# Patient Record
Sex: Female | Born: 1960 | Race: White | Hispanic: No | Marital: Single | Smoking: Never smoker
Health system: Southern US, Community
[De-identification: ages and names within clinical notes are randomized; demographics above are authoritative.]

## PROBLEM LIST (undated history)

## (undated) DIAGNOSIS — N6310 Unspecified lump in the right breast, unspecified quadrant: Secondary | ICD-10-CM

## (undated) DIAGNOSIS — J302 Other seasonal allergic rhinitis: Secondary | ICD-10-CM

## (undated) DIAGNOSIS — B029 Zoster without complications: Secondary | ICD-10-CM

## (undated) DIAGNOSIS — K219 Gastro-esophageal reflux disease without esophagitis: Secondary | ICD-10-CM

## (undated) DIAGNOSIS — N390 Urinary tract infection, site not specified: Secondary | ICD-10-CM

## (undated) HISTORY — PX: TONSILLECTOMY: SUR1361

## (undated) HISTORY — PX: ABDOMINAL HYSTERECTOMY: SHX81

## (undated) HISTORY — PX: OTHER SURGICAL HISTORY: SHX169

---

## 2005-12-30 ENCOUNTER — Emergency Department: Payer: Self-pay | Admitting: Emergency Medicine

## 2005-12-31 ENCOUNTER — Ambulatory Visit: Payer: Self-pay | Admitting: Emergency Medicine

## 2006-01-17 ENCOUNTER — Ambulatory Visit: Payer: Self-pay

## 2006-02-05 ENCOUNTER — Ambulatory Visit: Payer: Self-pay

## 2006-02-08 ENCOUNTER — Ambulatory Visit: Payer: Self-pay

## 2006-05-02 ENCOUNTER — Ambulatory Visit: Payer: Self-pay

## 2007-02-18 ENCOUNTER — Ambulatory Visit: Payer: Self-pay

## 2013-08-26 ENCOUNTER — Ambulatory Visit: Payer: Self-pay | Admitting: Obstetrics and Gynecology

## 2013-08-31 ENCOUNTER — Ambulatory Visit: Payer: Self-pay | Admitting: Obstetrics and Gynecology

## 2013-11-13 ENCOUNTER — Ambulatory Visit: Payer: Self-pay | Admitting: Unknown Physician Specialty

## 2014-04-07 ENCOUNTER — Ambulatory Visit: Payer: Self-pay | Admitting: Obstetrics and Gynecology

## 2014-05-04 IMAGING — US US BREAST*R* LIMITED INC AXILLA
1 series · 7 of 7 positions shown · non-contrast
Comparison: Previous examinations.

CLINICAL DATA: Recall from screening mammogram.

EXAM:
DIGITAL DIAGNOSTIC  right breast MAMMOGRAM
ULTRASOUND right BREAST

[Series 1: us breast*right* limited inc axilla · 0.08mm/px · 7 of 7 slices shown]
[im 1/7]
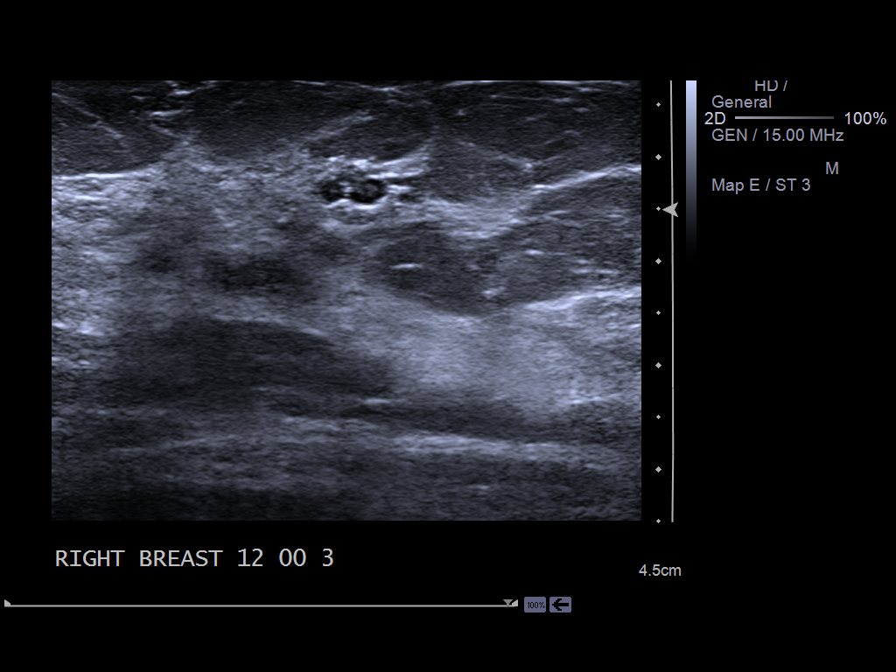
[im 2/7]
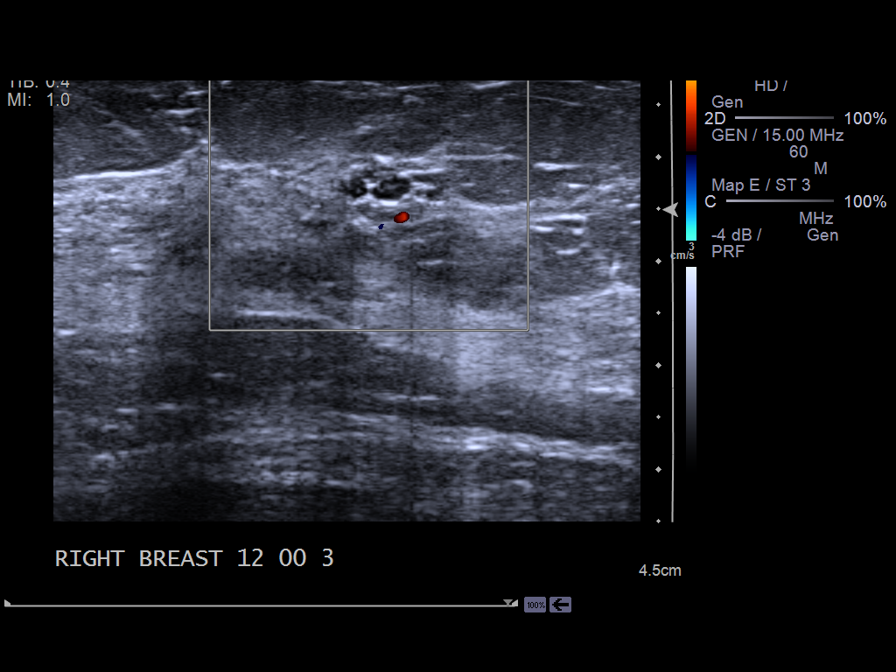
[im 3/7]
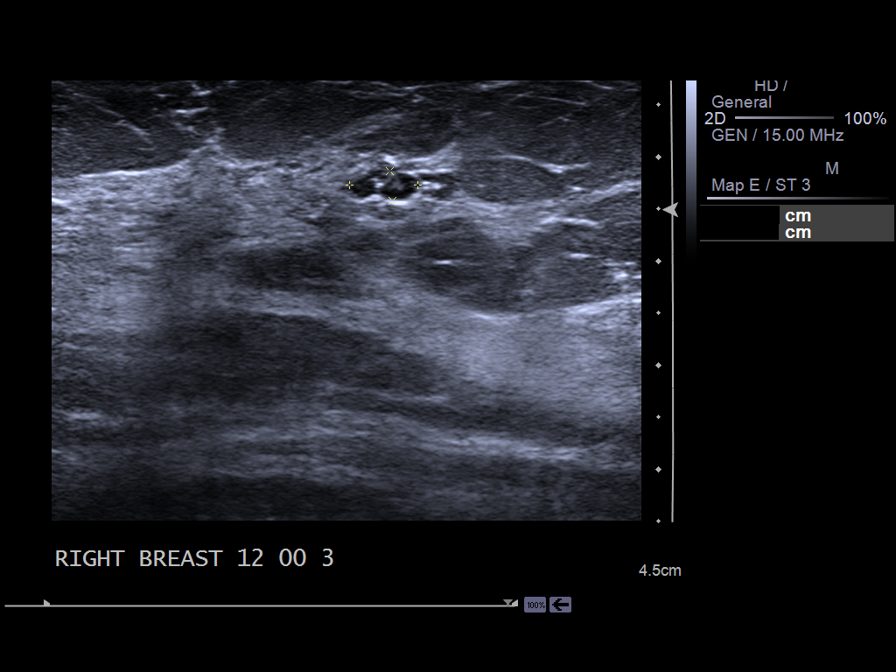
[im 4/7]
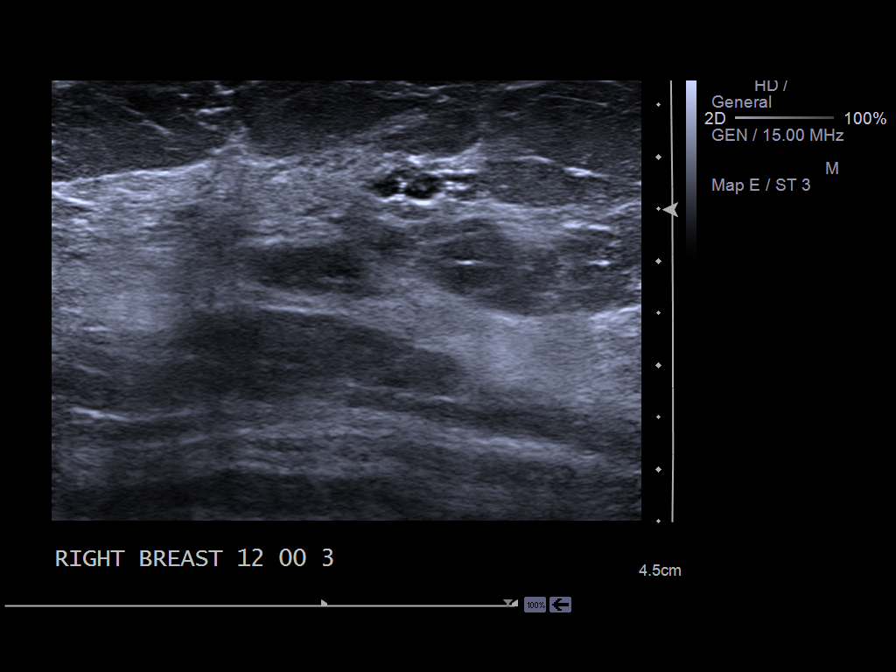
[im 5/7]
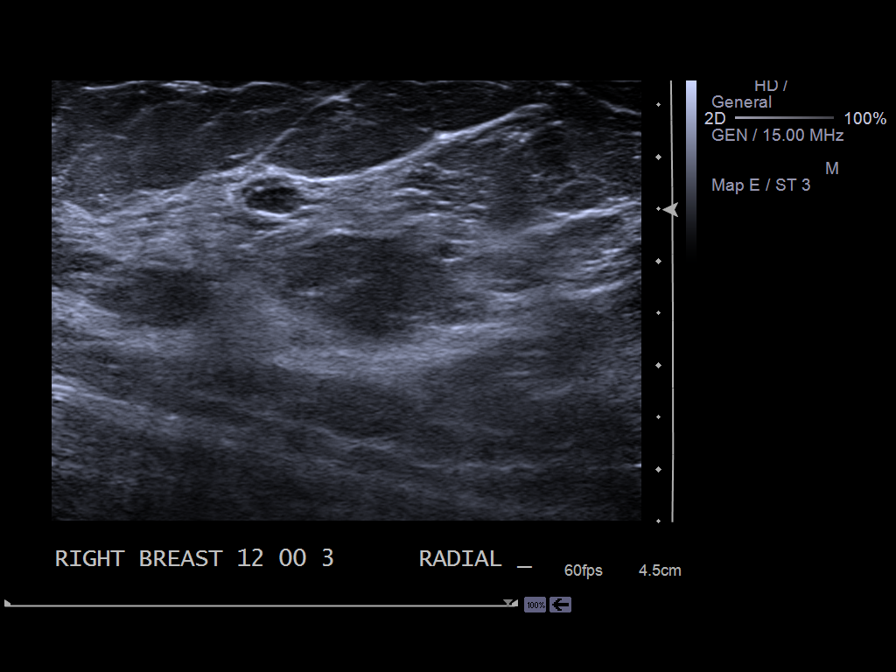
[im 6/7]
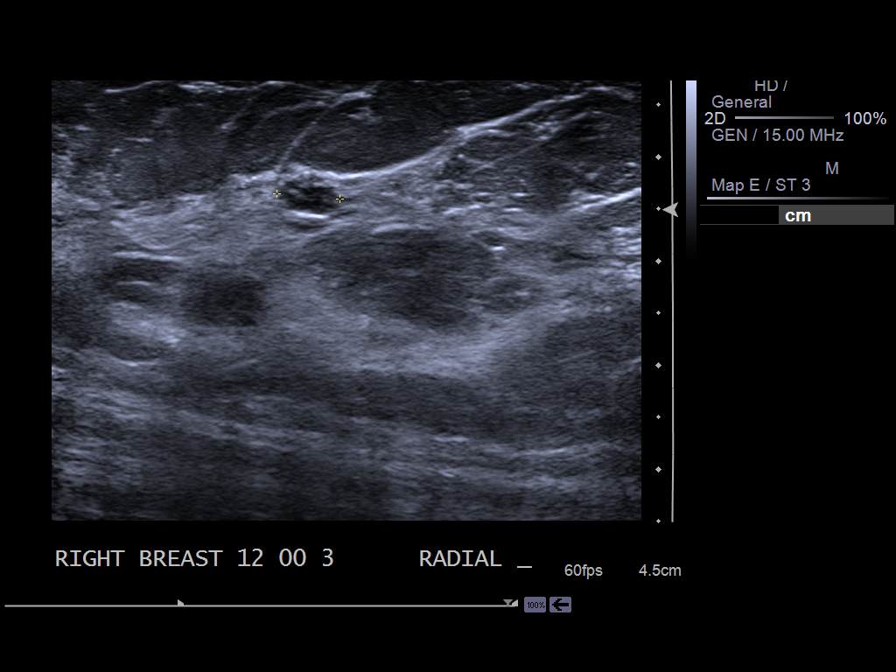
[im 7/7]
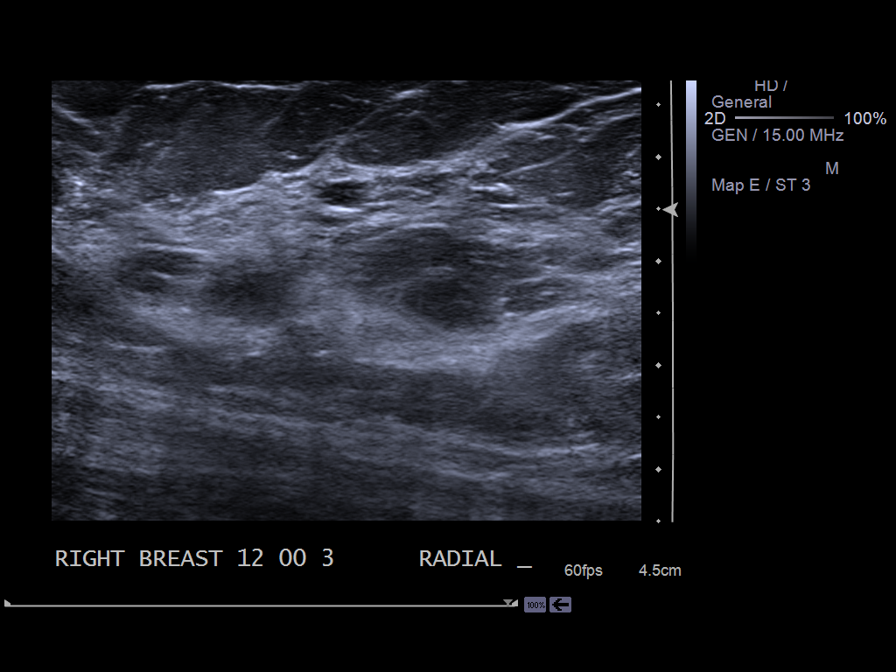

[7 of 7 positions shown; findings below may reference images not displayed]

ACR Breast Density Category c: The breast tissue is heterogeneously
dense, which may obscure small masses.
FINDINGS: Additional views the right breast demonstrate a small, oval,
circumscribed mass to be located superiorly within the right breast
at approximately the 12 o'clock position. Also, there is a tiny (2-3
mm) oval, circumscribed nodule located within the upper inner
quadrant of the right breast.

On physical exam, there is no discrete palpable abnormality and
there are no skin lesions present.

Ultrasound is performed, showing an oval circumscribed nodule with
internal echoes located within the right breast at 12 o'clock
position 3 cm from the nipple measuring 7 x 6 x 3 mm in size
corresponding to the larger superficial nodule seen in this region
on mammography. This is most consistent with a mildly complicated
cyst. The smaller more medially and posteriorly located nodule does
not have an ultrasound correlate.

These are felt to represent probably benign nodules with the smaller
nodule not visible by ultrasound. This smaller nodule may have been
present on previous studies but difficult to visualize due to
adjacent tissue. I recommend followup right breast diagnostic
mammogram and ultrasound in 6 months. The findings were discussed
with the patient.
IMPRESSION: Two small probably benign nodules located within the right breast
(upper inner quadrant and 12 o'clock position as discussed above).
Recommend followup right breast diagnostic mammogram and ultrasound
in 6 months.

RECOMMENDATION:
Right breast diagnostic mammogram and ultrasound in 6 months.

I have discussed the findings and recommendations with the patient.
Results were also provided in writing at the conclusion of the
visit. If applicable, a reminder letter will be sent to the patient
regarding the next appointment.

BI-RADS CATEGORY  3: Probably benign finding(s) - short interval
follow-up suggested.

## 2014-11-03 ENCOUNTER — Ambulatory Visit
Admit: 2014-11-03 | Disposition: A | Discharge status: Home / Self Care | Payer: Self-pay | Attending: Obstetrics and Gynecology | Admitting: Obstetrics and Gynecology

## 2014-12-09 IMAGING — US US BREAST*R* LIMITED INC AXILLA
1 series · 9 of 9 positions shown · non-contrast
Comparison: Previous exams.

CLINICAL DATA: Six-month follow-up of 2 probably benign right
breast masses, with only the mass in the right breast at 12 o'clock
seen sonographically.

EXAM:
DIGITAL DIAGNOSTIC  RIGHT MAMMOGRAM
ULTRASOUND RIGHT BREAST

[Series 1: us breast*right* limited inc axilla · 0.08mm/px · 9 of 9 slices shown]
[im 1/9]
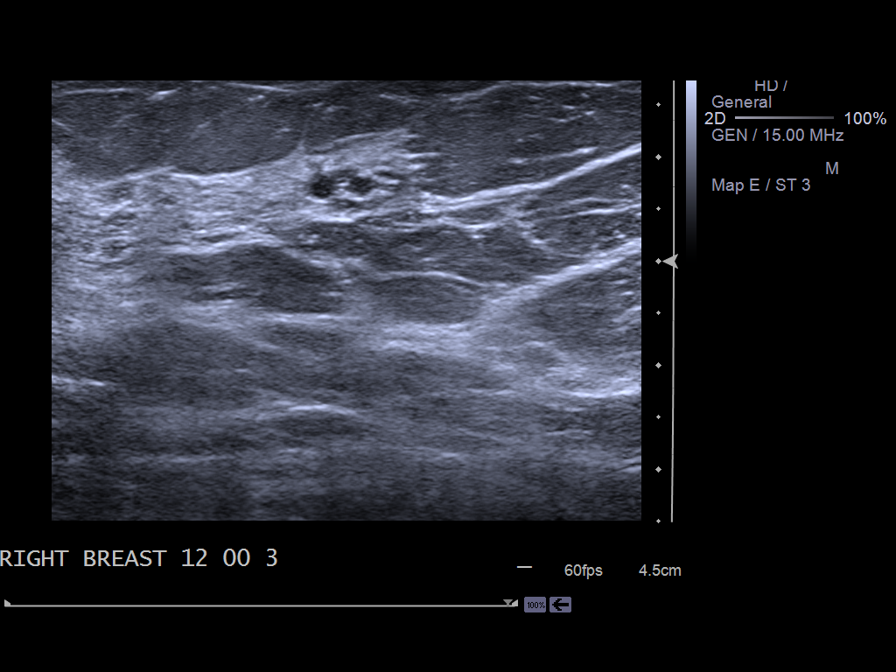
[im 2/9]
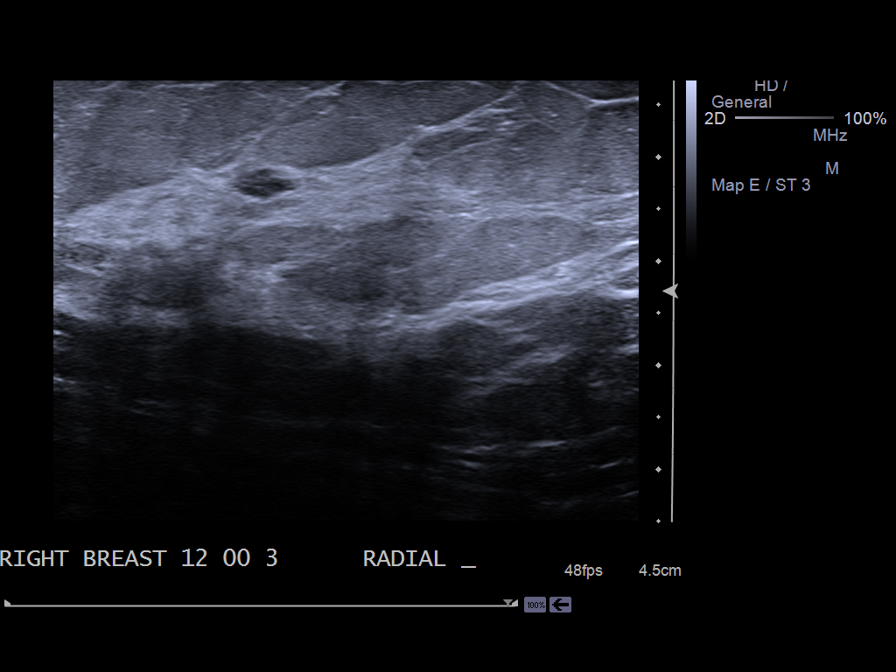
[im 3/9]
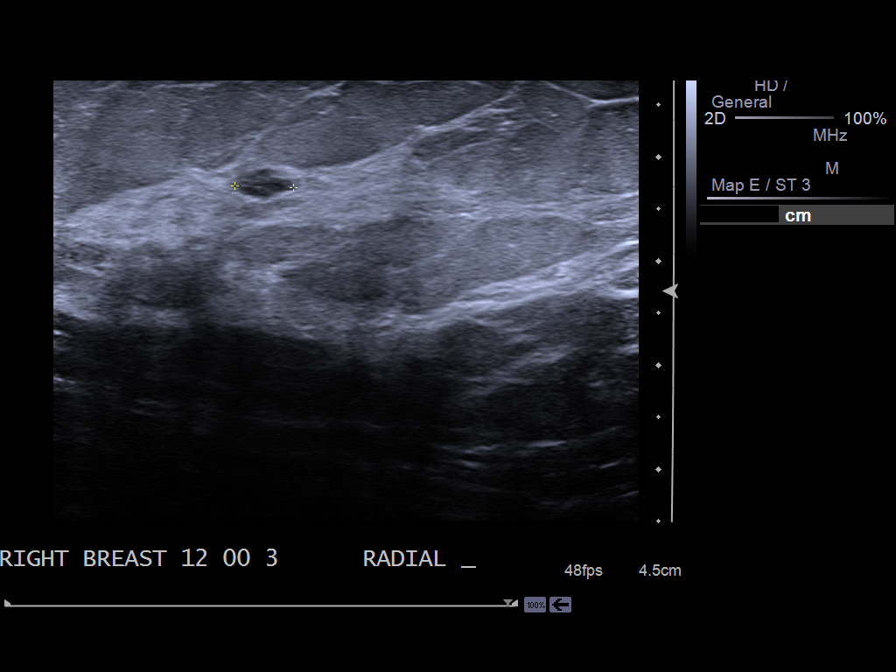
[im 4/9]
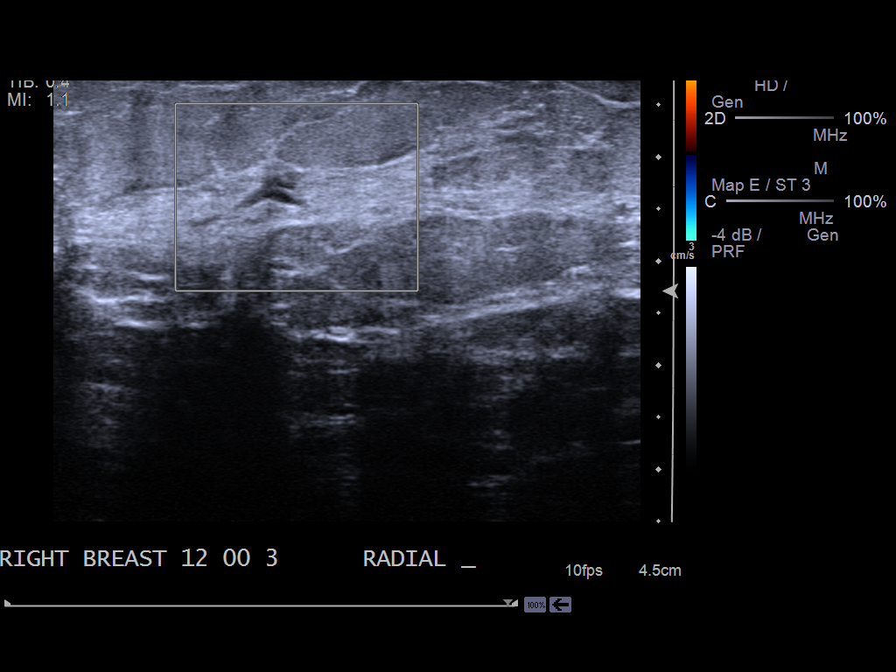
[im 5/9]
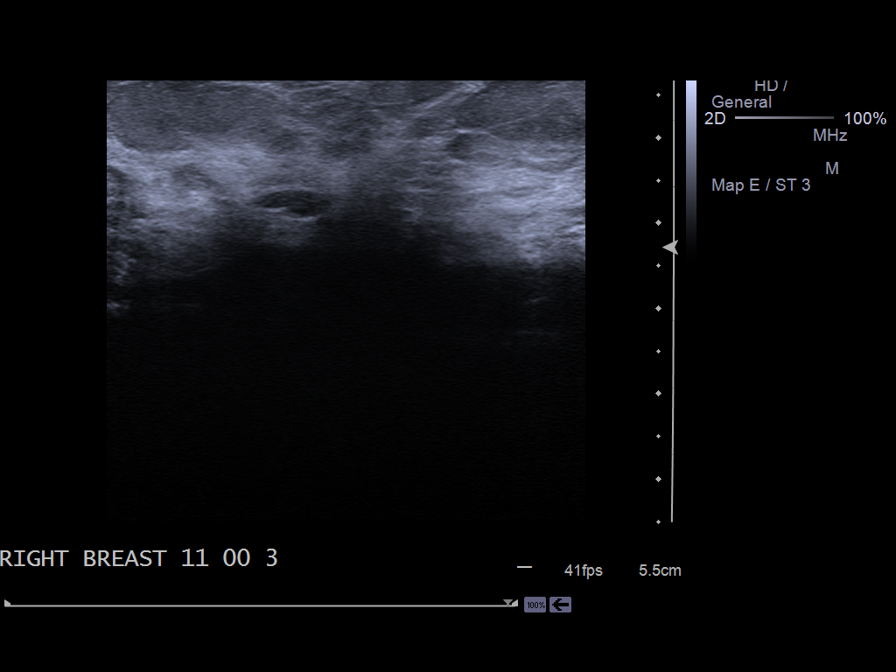
[im 6/9]
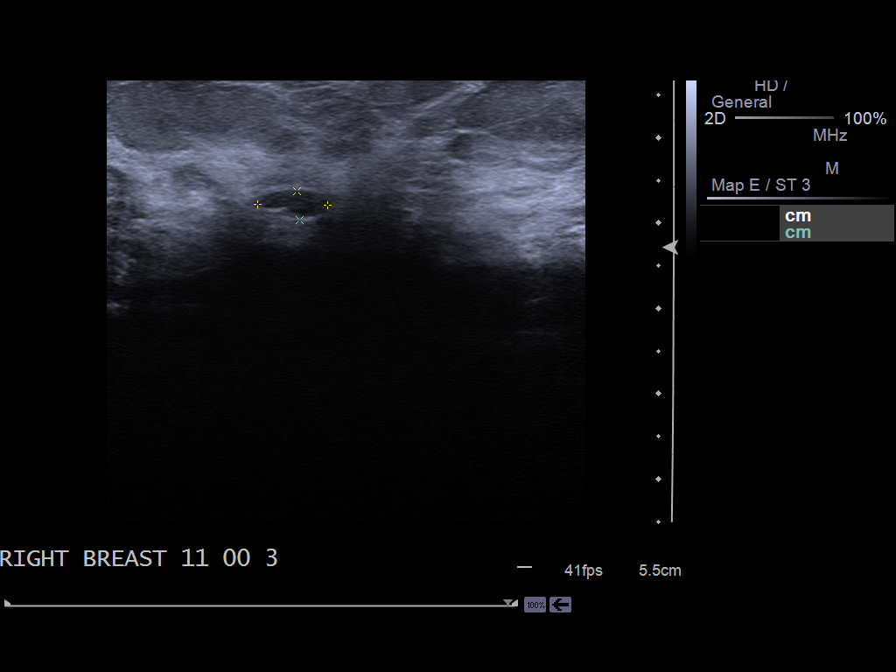
[im 7/9]
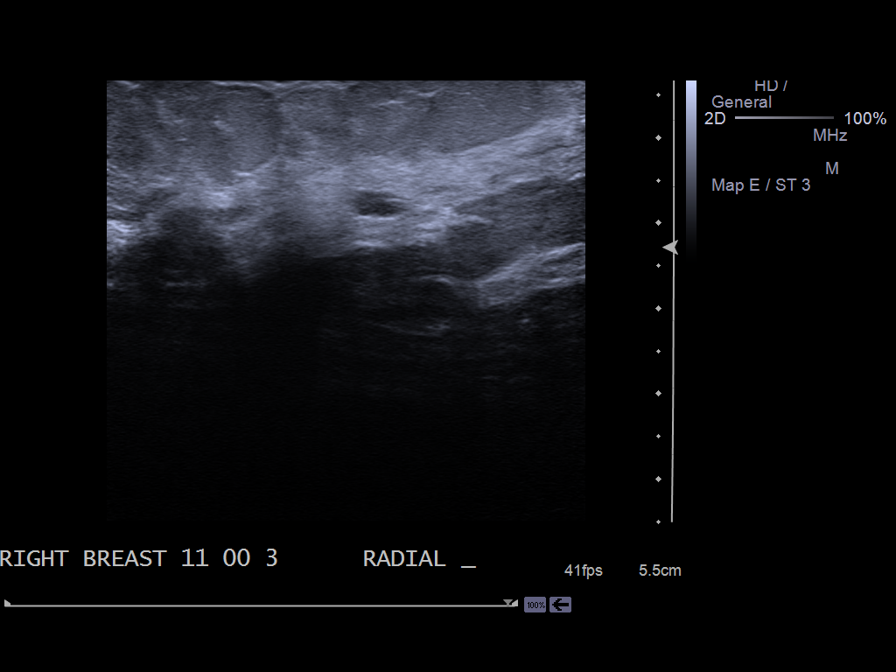
[im 8/9]
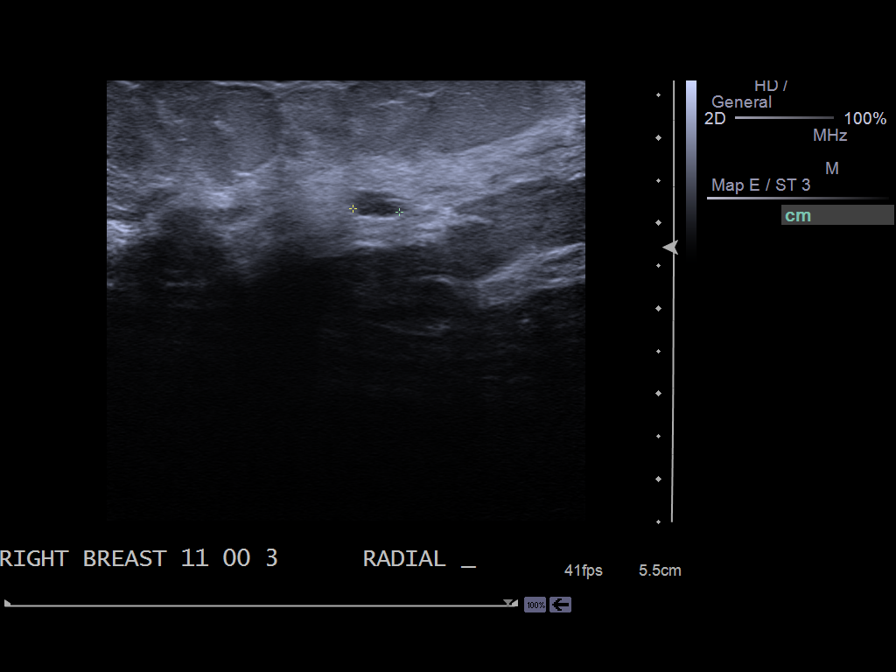
[im 9/9]
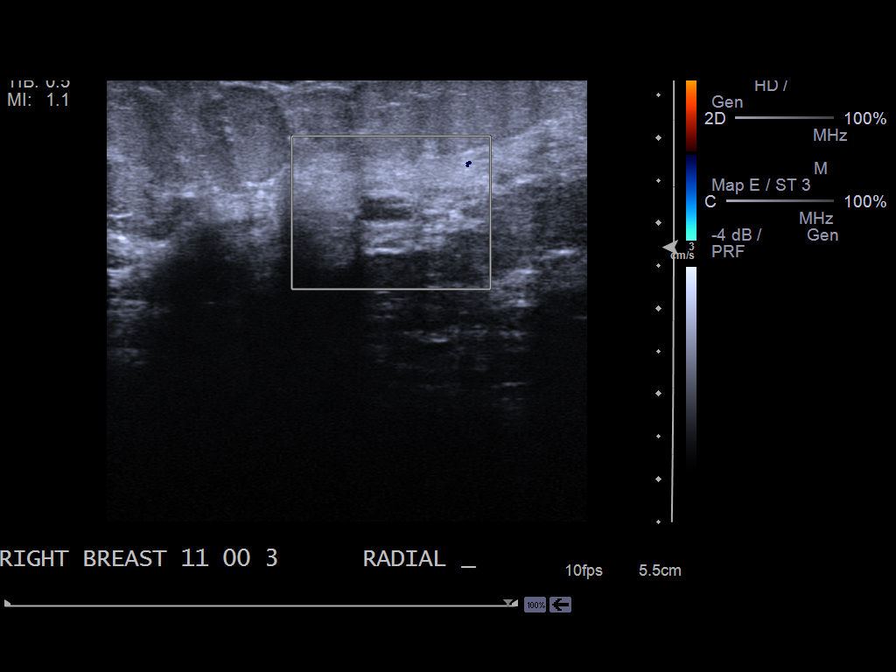

[9 of 9 positions shown; findings below may reference images not displayed]

ACR Breast Density Category c: The breast tissue is heterogeneously
dense, which may obscure small masses.
FINDINGS: No suspicious masses or calcifications are seen in the right breast.
There is a stable oval circumscribed mass seen in the slightly upper
right breast anterior depth measuring approximately 5-6 mm,
confirmed on the additional spot compression MLO view. The
previously seen probably benign mass in the medial right breast is
not seen on today's exam.

Physical examination of the upper right breast is not reveal any
palpable masses.

Targeted ultrasound of the right breast was performed demonstrating
an oval circumscribed mass at 12 o'clock 3 cm from the nipple
measuring 0.6 x 0.2 x 0.6 cm, unchanged in size and appearance. In
addition, at 11 o'clock in the right breast 3 cm from the nipple a
cyst is seen measuring 0.8 x 0.3 x 0.6 cm.
IMPRESSION: Stable probably benign mass in the right breast.

RECOMMENDATION:
Bilateral diagnostic mammography in 12 months with possible right
breast ultrasound to demonstrate stability of the probably benign
mass in the right breast at 12 o'clock.

I have discussed the findings and recommendations with the patient.
Results were also provided in writing at the conclusion of the
visit. If applicable, a reminder letter will be sent to the patient
regarding the next appointment.

BI-RADS CATEGORY  3: Probably benign.

## 2015-07-07 IMAGING — US US BREAST*R* LIMITED INC AXILLA
1 series · 2 of 2 positions shown · non-contrast
Comparison: Right breast ultrasound 04/07/2014 and 08/31/2013,
diagnostic right mammogram 04/07/2014 and 08/26/2013

CLINICAL DATA: Annual examination of both breasts and follow-up of
probably benign mass 12 o'clock position 3 cm from the nipple in the
right breast.

EXAM:
DIGITAL DIAGNOSTIC BILATERAL MAMMOGRAM WITH 3D TOMOSYNTHESIS WITH
CAD
ULTRASOUND RIGHT BREAST

[Series 1: us breast*right* limited inc axilla · 0.08mm/px · 2 of 2 slices shown]
[im 1/2]
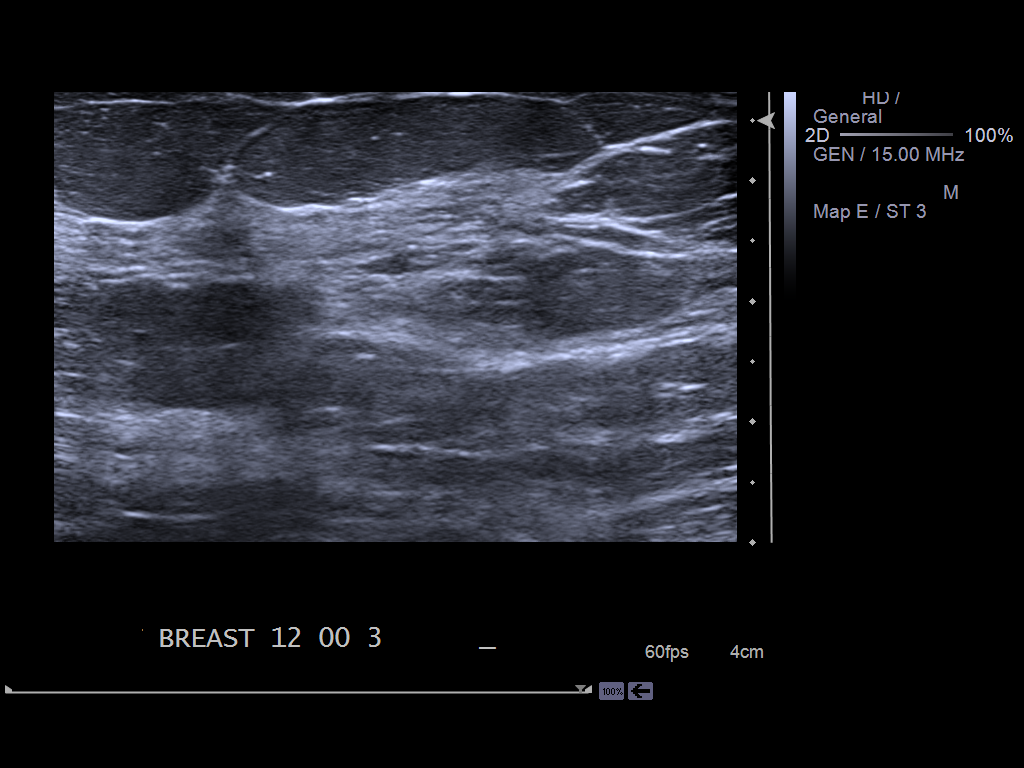
[im 2/2]
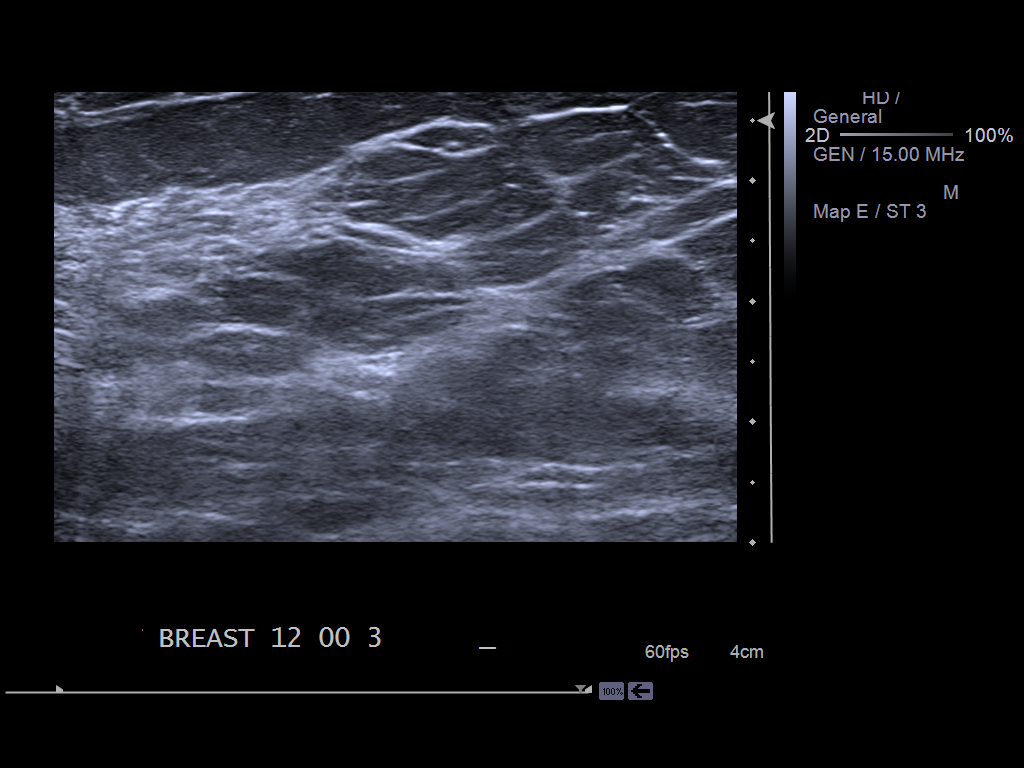

[2 of 2 positions shown; findings below may reference images not displayed]

ACR Breast Density Category b: There are scattered areas of
fibroglandular density.
FINDINGS: No new or suspicious mass, distortion, or suspicious
microcalcification is identified in either breast to suggest
malignancy.

Mammographic images were processed with CAD.

On physical exam, no mass is palpated in the 12 o'clock position
right breast 3 cm from the nipple.

Targeted ultrasound is performed, showing an island of dense
glandular tissue 12 o'clock position 3 cm nipple. Previously
described probable mildly complicated cyst is not identified on
today's ultrasound, despite thorough imaging in the upper central
right breast. This suggests that the previously imaged probable
complicated cyst on ultrasound has resolved in the interval. No
suspicious findings are seen on ultrasound today.
IMPRESSION: No evidence of malignancy in either breast.

RECOMMENDATION:
Screening mammogram in one year.(Code:63-E-NLS)

I have discussed the findings and recommendations with the patient.
Results were also provided in writing at the conclusion of the
visit. If applicable, a reminder letter will be sent to the patient
regarding the next appointment.

BI-RADS CATEGORY  1: Negative.

## 2015-10-18 ENCOUNTER — Other Ambulatory Visit: Payer: Self-pay | Admitting: Obstetrics and Gynecology

## 2015-10-18 DIAGNOSIS — Z1231 Encounter for screening mammogram for malignant neoplasm of breast: Secondary | ICD-10-CM

## 2015-11-04 ENCOUNTER — Ambulatory Visit
Admission: RE | Admit: 2015-11-04 | Discharge: 2015-11-04 | Disposition: A | Discharge status: Home / Self Care | Payer: Medicaid Other | Source: Ambulatory Visit | Attending: Obstetrics and Gynecology | Admitting: Obstetrics and Gynecology

## 2015-11-04 DIAGNOSIS — Z1231 Encounter for screening mammogram for malignant neoplasm of breast: Secondary | ICD-10-CM | POA: Diagnosis present

## 2015-12-23 ENCOUNTER — Ambulatory Visit: Admission: RE | Admit: 2015-12-23 | Payer: Medicaid Other | Source: Ambulatory Visit | Admitting: Gastroenterology

## 2015-12-23 ENCOUNTER — Encounter: Admission: RE | Payer: Self-pay | Source: Ambulatory Visit

## 2015-12-23 SURGERY — COLONOSCOPY WITH PROPOFOL
Anesthesia: General

## 2016-05-04 ENCOUNTER — Ambulatory Visit: Payer: Medicaid Other | Admitting: Certified Registered"

## 2016-05-04 ENCOUNTER — Encounter
Admission: RE | Disposition: A | Discharge status: Home / Self Care | Payer: Self-pay | Source: Ambulatory Visit | Attending: Gastroenterology

## 2016-05-04 ENCOUNTER — Ambulatory Visit
Admission: RE | Admit: 2016-05-04 | Discharge: 2016-05-04 | Disposition: A | Discharge status: Home / Self Care | Payer: Medicaid Other | Source: Ambulatory Visit | Attending: Gastroenterology | Admitting: Gastroenterology

## 2016-05-04 ENCOUNTER — Encounter: Payer: Self-pay | Admitting: *Deleted

## 2016-05-04 DIAGNOSIS — Z1211 Encounter for screening for malignant neoplasm of colon: Secondary | ICD-10-CM | POA: Diagnosis present

## 2016-05-04 DIAGNOSIS — Z79899 Other long term (current) drug therapy: Secondary | ICD-10-CM | POA: Diagnosis not present

## 2016-05-04 DIAGNOSIS — K219 Gastro-esophageal reflux disease without esophagitis: Secondary | ICD-10-CM | POA: Diagnosis not present

## 2016-05-04 DIAGNOSIS — K640 First degree hemorrhoids: Secondary | ICD-10-CM | POA: Diagnosis not present

## 2016-05-04 DIAGNOSIS — D123 Benign neoplasm of transverse colon: Secondary | ICD-10-CM | POA: Diagnosis not present

## 2016-05-04 DIAGNOSIS — Z8619 Personal history of other infectious and parasitic diseases: Secondary | ICD-10-CM | POA: Diagnosis not present

## 2016-05-04 HISTORY — DX: Urinary tract infection, site not specified: N39.0

## 2016-05-04 HISTORY — PX: COLONOSCOPY WITH PROPOFOL: SHX5780

## 2016-05-04 HISTORY — DX: Unspecified lump in the right breast, unspecified quadrant: N63.10

## 2016-05-04 HISTORY — DX: Gastro-esophageal reflux disease without esophagitis: K21.9

## 2016-05-04 HISTORY — DX: Other seasonal allergic rhinitis: J30.2

## 2016-05-04 HISTORY — DX: Zoster without complications: B02.9

## 2016-05-04 SURGERY — COLONOSCOPY WITH PROPOFOL
Anesthesia: General

## 2016-05-04 MED ORDER — SODIUM CHLORIDE 0.9 % IV SOLN
INTRAVENOUS | Status: DC
  Administered 2016-05-04: 10:00:00 via INTRAVENOUS
  Administered 2016-05-04: 1000 mL via INTRAVENOUS

## 2016-05-04 MED ORDER — LIDOCAINE HCL (CARDIAC) 20 MG/ML IV SOLN
INTRAVENOUS | Status: DC | PRN
  Administered 2016-05-04: 60 mg via INTRAVENOUS

## 2016-05-04 MED ORDER — PROPOFOL 10 MG/ML IV BOLUS
INTRAVENOUS | Status: DC | PRN
  Administered 2016-05-04: 50 mg via INTRAVENOUS

## 2016-05-04 MED ORDER — MIDAZOLAM HCL 2 MG/2ML IJ SOLN
INTRAMUSCULAR | Status: DC | PRN
  Administered 2016-05-04: 2 mg via INTRAVENOUS

## 2016-05-04 MED ORDER — PROPOFOL 500 MG/50ML IV EMUL
INTRAVENOUS | Status: DC | PRN
  Administered 2016-05-04: 150 ug/kg/min via INTRAVENOUS

## 2016-05-04 MED ORDER — SODIUM CHLORIDE 0.9 % IV SOLN: INTRAVENOUS | Status: DC

## 2016-05-04 NOTE — Anesthesia Postprocedure Evaluation (Signed)
Anesthesia Post Note  Patient: Makayla Berry  Procedure(s) Performed: Procedure(s) (LRB): COLONOSCOPY WITH PROPOFOL (N/A)  Patient location during evaluation: Endoscopy Anesthesia Type: General Level of consciousness: awake and alert Pain management: pain level controlled Vital Signs Assessment: post-procedure vital signs reviewed and stable Respiratory status: spontaneous breathing, nonlabored ventilation and respiratory function stable Cardiovascular status: blood pressure returned to baseline and stable Postop Assessment: no signs of nausea or vomiting Anesthetic complications: no    Last Vitals:  Vitals:   05/04/16 1049 05/04/16 1059  BP: 131/82 (!) 146/99  Pulse: 69 66  Resp: 17 13  Temp:      Last Pain:  Vitals:   05/04/16 0928  TempSrc: Tympanic  PainSc: 5                  Martha Clan

## 2016-05-04 NOTE — H&P (Signed)
Outpatient short stay form Pre-procedure 05/04/2016 9:56 AM Lollie Sails MD  Primary Physician: Pauline Good CNMW  Reason for visit:  Screening colonoscopy  History of present illness:  Patient is a 55 year old female presenting today as above. She tolerated her prep well. She takes no aspirin or blood thinning agents.    Current Facility-Administered Medications:  .  0.9 %  sodium chloride infusion, , Intravenous, Continuous, Lollie Sails, MD, Last Rate: 20 mL/hr at 05/04/16 0945, 1,000 mL at 05/04/16 0945 .  0.9 %  sodium chloride infusion, , Intravenous, Continuous, Lollie Sails, MD  Prescriptions Prior to Admission  Medication Sig Dispense Refill Last Dose  . diphenhydramine-acetaminophen (TYLENOL PM) 25-500 MG TABS tablet Take 1 tablet by mouth at bedtime as needed.     Marland Kitchen ibuprofen (ADVIL,MOTRIN) 200 MG tablet Take 200 mg by mouth every 6 (six) hours as needed.     . Multiple Vitamin (MULTIVITAMIN) tablet Take 1 tablet by mouth daily.     . naproxen sodium (ANAPROX) 220 MG tablet Take 220 mg by mouth 2 (two) times daily with a meal.        No Known Allergies   Past Medical History:  Diagnosis Date  . GERD (gastroesophageal reflux disease)   . Mass of breast, right   . Seasonal allergies   . Shingles   . UTI (lower urinary tract infection)     Review of systems:      Physical Exam    Heart and lungs: Regular rate and rhythm without rub or gallop, lungs are bilaterally clear    HEENT: Normocephalic atraumatic eyes are anicteric    Other:     Pertinant exam for procedure: Soft nontender nondistended bowel sounds positive normoactive    Planned proceedures: Colonoscopy and indicated procedures. I have discussed the risks benefits and complications of procedures to include not limited to bleeding, infection, perforation and the risk of sedation and the patient wishes to proceed.    Lollie Sails, MD Gastroenterology 05/04/2016  9:56  AM

## 2016-05-04 NOTE — Transfer of Care (Signed)
Immediate Anesthesia Transfer of Care Note  Patient: Makayla Berry  Procedure(s) Performed: Procedure(s): COLONOSCOPY WITH PROPOFOL (N/A)  Patient Location: Endoscopy Unit  Anesthesia Type:General  Level of Consciousness: awake, alert , oriented and patient cooperative  Airway & Oxygen Therapy: Patient Spontanous Breathing and Patient connected to nasal cannula oxygen  Post-op Assessment: Report given to RN, Post -op Vital signs reviewed and stable and Patient moving all extremities X 4  Post vital signs: Reviewed and stable  Last Vitals:  Vitals:   05/04/16 0928  BP: (!) 193/91  Pulse: 73  Resp: 16  Temp: 36.5 C    Last Pain:  Vitals:   05/04/16 0928  TempSrc: Tympanic  PainSc: 5          Complications: No apparent anesthesia complications

## 2016-05-04 NOTE — Anesthesia Preprocedure Evaluation (Signed)
Anesthesia Evaluation  Patient identified by MRN, date of birth, ID band Patient awake    Reviewed: Allergy & Precautions, H&P , NPO status , Patient's Chart, lab work & pertinent test results, reviewed documented beta blocker date and time   History of Anesthesia Complications Negative for: history of anesthetic complications  Airway Mallampati: III  TM Distance: >3 FB Neck ROM: full    Dental no notable dental hx. (+) Implants, Missing   Pulmonary neg pulmonary ROS,    Pulmonary exam normal breath sounds clear to auscultation       Cardiovascular Exercise Tolerance: Good hypertension (undiagnosed, but likely), Normal cardiovascular exam Rhythm:regular Rate:Normal     Neuro/Psych negative neurological ROS  negative psych ROS   GI/Hepatic Neg liver ROS, GERD  ,  Endo/Other  negative endocrine ROS  Renal/GU negative Renal ROS  negative genitourinary   Musculoskeletal   Abdominal   Peds  Hematology negative hematology ROS (+)   Anesthesia Other Findings Past Medical History: No date: GERD (gastroesophageal reflux disease) No date: Mass of breast, right No date: Seasonal allergies No date: Shingles No date: UTI (lower urinary tract infection)   Reproductive/Obstetrics negative OB ROS                             Anesthesia Physical Anesthesia Plan  ASA: II  Anesthesia Plan: General   Post-op Pain Management:    Induction:   Airway Management Planned:   Additional Equipment:   Intra-op Plan:   Post-operative Plan:   Informed Consent: I have reviewed the patients History and Physical, chart, labs and discussed the procedure including the risks, benefits and alternatives for the proposed anesthesia with the patient or authorized representative who has indicated his/her understanding and acceptance.   Dental Advisory Given  Plan Discussed with: Anesthesiologist, CRNA and  Surgeon  Anesthesia Plan Comments:         Anesthesia Quick Evaluation

## 2016-05-04 NOTE — Op Note (Signed)
[facility] Gastroenterology Patient Name: Makayla Berry Procedure Date: 05/04/2016 10:01 AM MRN: [id number] Account #: [id number] Date of Birth: 1960/08/30 Admit Type: Outpatient Age: 55 Room: [facility] ENDO ROOM 4 Gender: Female Note Status: Finalized Procedure:            Colonoscopy Indications:          Screening for colorectal malignant neoplasm Providers:            Lollie Sails, MD Referring MD:         Catheryn Bacon CNM, CNM (Referring MD) Medicines:            Monitored Anesthesia Care Complications:        No immediate complications. Procedure:            Pre-Anesthesia Assessment:                       - ASA Grade Assessment: II - A patient with mild                        systemic disease.                       After obtaining informed consent, the colonoscope was                        passed under direct vision. Throughout the procedure,                        the patient's blood pressure, pulse, and oxygen                        saturations were monitored continuously. The                        Colonoscope was introduced through the anus and                        advanced to the the cecum, identified by appendiceal                        orifice and ileocecal valve. The patient tolerated the                        procedure well. The quality of the bowel preparation                        was good. Findings:      A 4 mm polyp was found in the splenic flexure. The polyp was flat. The       polyp was removed with a cold snare. Resection and retrieval were       complete.      Non-bleeding internal hemorrhoids were found during retroflexion and       during anoscopy. The hemorrhoids were small and Grade I (internal       hemorrhoids that do not prolapse).      The exam was otherwise without abnormality.      The digital rectal exam was normal. Impression:           - One 4 mm polyp at the splenic flexure, removed with a   cold snare. Resected and retrieved.                       -  Non-bleeding internal hemorrhoids.                       - The examination was otherwise normal. Recommendation:       - Discharge patient to home. Procedure Code(s):    --- Professional ---                       [phone], Colonoscopy, flexible; with removal of tumor(s),                        polyp(s), or other lesion(s) by snare technique Diagnosis Code(s):    --- Professional ---                       Z12.11, Encounter for screening for malignant neoplasm                        of colon                       D12.3, Benign neoplasm of transverse colon (hepatic                        flexure or splenic flexure)                       K64.0, First degree hemorrhoids CPT copyright 2016 [employer]. All rights reserved. The codes documented in this report are preliminary and upon coder review may  be revised to meet current compliance requirements. Lollie Sails, MD 05/04/2016 10:36:12 AM This report has been signed electronically. Number of Addenda: 0 Note Initiated On: 05/04/2016 10:01 AM Scope Withdrawal Time: 0 hours 10 minutes 38 seconds  Total Procedure Duration: 0 hours 24 minutes 37 seconds       [facility]

## 2016-05-07 ENCOUNTER — Encounter: Payer: Self-pay | Admitting: Gastroenterology

## 2016-05-07 LAB — SURGICAL PATHOLOGY

## 2016-10-24 ENCOUNTER — Other Ambulatory Visit: Payer: Self-pay | Admitting: Obstetrics and Gynecology

## 2016-10-24 DIAGNOSIS — Z1239 Encounter for other screening for malignant neoplasm of breast: Secondary | ICD-10-CM

## 2016-11-29 ENCOUNTER — Ambulatory Visit: Payer: Medicaid Other

## 2016-12-07 ENCOUNTER — Ambulatory Visit
Admission: RE | Admit: 2016-12-07 | Discharge: 2016-12-07 | Disposition: A | Discharge status: Home / Self Care | Payer: Medicaid Other | Source: Ambulatory Visit | Attending: Obstetrics and Gynecology | Admitting: Obstetrics and Gynecology

## 2016-12-07 DIAGNOSIS — Z1231 Encounter for screening mammogram for malignant neoplasm of breast: Secondary | ICD-10-CM | POA: Diagnosis present

## 2016-12-07 DIAGNOSIS — Z1239 Encounter for other screening for malignant neoplasm of breast: Secondary | ICD-10-CM

## 2023-08-08 ENCOUNTER — Encounter: Payer: Self-pay | Admitting: Emergency Medicine

## 2023-08-08 ENCOUNTER — Other Ambulatory Visit: Payer: Self-pay

## 2023-08-08 ENCOUNTER — Emergency Department
Admission: EM | Admit: 2023-08-08 | Discharge: 2023-08-08 | Disposition: A | Discharge status: Home / Self Care | Payer: BLUE CROSS/BLUE SHIELD | Attending: Emergency Medicine | Admitting: Emergency Medicine

## 2023-08-08 DIAGNOSIS — E876 Hypokalemia: Secondary | ICD-10-CM | POA: Insufficient documentation

## 2023-08-08 DIAGNOSIS — J029 Acute pharyngitis, unspecified: Secondary | ICD-10-CM | POA: Diagnosis present

## 2023-08-08 DIAGNOSIS — U071 COVID-19: Secondary | ICD-10-CM | POA: Insufficient documentation

## 2023-08-08 LAB — CBC WITH DIFFERENTIAL/PLATELET
Abs Immature Granulocytes: 0.03 10*3/uL (ref 0.00–0.07)
Basophils Absolute: 0.1 10*3/uL (ref 0.0–0.1)
Basophils Relative: 1 %
Eosinophils Absolute: 0 10*3/uL (ref 0.0–0.5)
Eosinophils Relative: 0 %
HCT: 43.4 % (ref 36.0–46.0)
Hemoglobin: 14.9 g/dL (ref 12.0–15.0)
Immature Granulocytes: 0 %
Lymphocytes Relative: 12 %
Lymphs Abs: 1 10*3/uL (ref 0.7–4.0)
MCH: 30 pg (ref 26.0–34.0)
MCHC: 34.3 g/dL (ref 30.0–36.0)
MCV: 87.3 fL (ref 80.0–100.0)
Monocytes Absolute: 0.7 10*3/uL (ref 0.1–1.0)
Monocytes Relative: 9 %
Neutro Abs: 6.2 10*3/uL (ref 1.7–7.7)
Neutrophils Relative %: 78 %
Platelets: 210 10*3/uL (ref 150–400)
RBC: 4.97 MIL/uL (ref 3.87–5.11)
RDW: 13.2 % (ref 11.5–15.5)
WBC: 7.9 10*3/uL (ref 4.0–10.5)
nRBC: 0 % (ref 0.0–0.2)

## 2023-08-08 LAB — BASIC METABOLIC PANEL
Anion gap: 16 — ABNORMAL HIGH (ref 5–15)
BUN: 17 mg/dL (ref 6–20)
CO2: 25 mmol/L (ref 22–32)
Calcium: 8.8 mg/dL — ABNORMAL LOW (ref 8.9–10.3)
Chloride: 95 mmol/L — ABNORMAL LOW (ref 98–111)
Creatinine, Ser: 0.96 mg/dL (ref 0.44–1.00)
GFR, Estimated: >60 mL/min (ref >60)
Glucose, Bld: 127 mg/dL — ABNORMAL HIGH (ref 70–99)
Potassium: 3 mmol/L — ABNORMAL LOW (ref 3.5–5.1)
Sodium: 136 mmol/L (ref 135–145)

## 2023-08-08 MED ORDER — POTASSIUM CHLORIDE 20 MEQ PO PACK
40.0000 meq | PACK | Freq: Two times a day (BID) | ORAL | Status: DC
  Administered 2023-08-08: 40 meq via ORAL
  Filled 2023-08-08: qty 2

## 2023-08-08 NOTE — ED Provider Notes (Signed)
 [facility] Provider Note    Event Date/Time   First MD Initiated Contact with Patient 08/08/23 1251     (approximate)   History   Arm Cramping   HPI  Makayla Berry is a 63 y.o. female who presents today for evaluation of bilateral arm cramping.  Patient reports that she was diagnosed with COVID on Tuesday.  She has had sore throat and runny nose, with diarrhea and 1 episode of vomiting yesterday.  She reports that she feels improved today.  She reports that she has been feeling very anxious because she lost her son 1 month ago.  She reports that she had an episode today where her hands curled bilaterally and she felt spasms in her arms, and was hyperventilating at the time.  Her husband called EMS but by the time that they arrived her symptoms had resolved.  Patient admits to not eating or drinking much over the past couple of days since being diagnosed with COVID.  She currently feels back to normal.  There are no active problems to display for this patient.         Physical Exam   Triage Vital Signs: ED Triage Vitals  Encounter Vitals Group     BP 08/08/23 1111 138/81     Systolic BP Percentile --      Diastolic BP Percentile --      Pulse Rate 08/08/23 1111 (!) 107     Resp 08/08/23 1111 18     Temp 08/08/23 1111 99.6 F (37.6 C)     Temp Source 08/08/23 1111 Oral     SpO2 08/08/23 1111 99 %     Weight 08/08/23 1112 180 lb (81.6 kg)     Height 08/08/23 1112 5' 7 (1.702 m)     Head Circumference --      Peak Flow --      Pain Score 08/08/23 1112 0     Pain Loc --      Pain Education --      Exclude from Growth Chart --     Most recent vital signs: Vitals:   08/08/23 1111  BP: 138/81  Pulse: (!) 107  Resp: 18  Temp: 99.6 F (37.6 C)  SpO2: 99%    Physical Exam Vitals and nursing note reviewed.  Constitutional:      General: Awake and alert. No acute distress.    Appearance: Normal appearance. The patient is normal weight.   HENT:     Head: Normocephalic and atraumatic.     Mouth: Mucous membranes are moist.  Eyes:     General: PERRL. Normal EOMs        Right eye: No discharge.        Left eye: No discharge.     Conjunctiva/sclera: Conjunctivae normal.  Cardiovascular:     Rate and Rhythm: Normal rate and regular rhythm.     Pulses: Normal pulses.  Pulmonary:     Effort: Pulmonary effort is normal. No respiratory distress.     Breath sounds: Normal breath sounds.  Abdominal:     Abdomen is soft. There is no abdominal tenderness. No rebound or guarding. No distention. Musculoskeletal:        General: No swelling. Normal range of motion.     Cervical back: Normal range of motion and neck supple.  Full and normal range of motion of bilateral hands, wrist, elbows, shoulders, normal and intact strength bilaterally Skin:    General: Skin is  warm and dry.     Capillary Refill: Capillary refill takes less than 2 seconds.     Findings: No rash.  Neurological:     Mental Status: The patient is awake and alert.   Neurological: GCS 15 alert and oriented x3 Normal speech, no expressive or receptive aphasia or dysarthria Cranial nerves II through XII intact Normal visual fields 5 out of 5 strength in all 4 extremities with intact sensation throughout No extremity drift Normal finger-to-nose testing, no limb or truncal ataxia    ED Results / Procedures / Treatments   Labs (all labs ordered are listed, but only abnormal results are displayed) Labs Reviewed  BASIC METABOLIC PANEL - Abnormal; Notable for the following components:      Result Value   Potassium 3.0 (*)    Chloride 95 (*)    Glucose, Bld 127 (*)    Calcium 8.8 (*)    Anion gap 16 (*)    All other components within normal limits  CBC WITH DIFFERENTIAL/PLATELET     EKG     RADIOLOGY     PROCEDURES:  Critical Care performed:   Procedures   MEDICATIONS ORDERED IN ED: Medications  potassium chloride  (KLOR-CON ) packet 40  mEq (40 mEq Oral Given 08/08/23 1304)     IMPRESSION / MDM / ASSESSMENT AND PLAN / ED COURSE  I reviewed the triage vital signs and the nursing notes.   Differential diagnosis includes, but is not limited to, dehydration, electrolyte disarray, anxiety.  Patient is awake and alert, mildly tachycardic on arrival though slightly anxious in appearance.  Labs obtained in triage are revealing for mild hypokalemia to 3.0 which could likely be contributing to her symptoms.  She has no chest pain, shortness of breath, fevers to suggest acute coronary syndrome or myocarditis.  She is nontoxic in appearance.  She has moist mucous membranes.  She reports that she feels back to her normal self.  She admits to being under a great deal of stress recently.  Her potassium was repleted.  She was monitored in the emergency department for over 2 hours with improvement of her symptoms.  We discussed return precautions and outpatient follow-up.  Recommended that she continue her isolation instructions unless she requires medical attention.  Also advised that she is dehydrated.  Advised that she does not need to change her diet to increase her potassium consumption as hyperkalemia can also be dangerous.  We discussed strict return precautions and the importance of establishing follow-up.  Patient understands and agrees with plan.  She was discharged in stable condition.   Patient's presentation is most consistent with acute complicated illness / injury requiring diagnostic workup.   FINAL CLINICAL IMPRESSION(S) / ED DIAGNOSES   Final diagnoses:  Hypokalemia  COVID-19     Rx / DC Orders   ED Discharge Orders     None        Note:  This document was prepared using Dragon voice recognition software and may include unintentional dictation errors.   Chistian Kasler E, PA-C 08/08/23 1706    Levander Slate, MD 08/09/23 [phone]

## 2023-08-08 NOTE — ED Notes (Signed)
 First Nurse Note: Pt to ED via [facility] from home for hand cramping and numbness that lasted about 10 minutes. Per EMS pt was getting something to eat and had an episode of nausea and that's when the hand cramping and numbness started. Pt does not have symptoms at this time. Pt is in NAD.

## 2023-08-08 NOTE — ED Triage Notes (Signed)
 Patient to ED via [facility] from home for cramping in bilateral arms. PT dx with Covid on Tuesday. States she was feeling better today until she had the arm cramping. No cramping at this time. States she might have had a panic attack.  Pt stating multiple times that she will not stay if she has to wait long.

## 2023-08-08 NOTE — Discharge Instructions (Signed)
 Your potassium level was slightly low which was repleted in the emergency department.  You do not need to change her diet to take more potassium at home.  Remember to stay hydrated.  Please return for any new, worsening, or change in symptoms or other concerns.  It was a pleasure caring for you today.
